# Patient Record
Sex: Male | Born: 1935 | Race: White | Hispanic: No | Marital: Married | Smoking: Never smoker
Health system: Southern US, Community
[De-identification: ages and names within clinical notes are randomized; demographics above are authoritative.]

## PROBLEM LIST (undated history)

## (undated) DIAGNOSIS — IMO0001 Reserved for inherently not codable concepts without codable children: Secondary | ICD-10-CM

## (undated) DIAGNOSIS — H409 Unspecified glaucoma: Secondary | ICD-10-CM

## (undated) DIAGNOSIS — K219 Gastro-esophageal reflux disease without esophagitis: Secondary | ICD-10-CM

## (undated) DIAGNOSIS — M1990 Unspecified osteoarthritis, unspecified site: Secondary | ICD-10-CM

## (undated) HISTORY — PX: CYST EXCISION: SHX5701

---

## 2008-10-04 ENCOUNTER — Encounter: Admission: RE | Admit: 2008-10-04 | Discharge: 2008-12-18 | Payer: Self-pay | Admitting: Orthopaedic Surgery

## 2008-11-08 ENCOUNTER — Ambulatory Visit (HOSPITAL_BASED_OUTPATIENT_CLINIC_OR_DEPARTMENT_OTHER): Admission: RE | Admit: 2008-11-08 | Discharge: 2008-11-08 | Payer: Self-pay | Admitting: Orthopaedic Surgery

## 2012-04-30 ENCOUNTER — Other Ambulatory Visit: Payer: Self-pay | Admitting: Specialist

## 2012-04-30 DIAGNOSIS — M533 Sacrococcygeal disorders, not elsewhere classified: Secondary | ICD-10-CM

## 2012-05-05 ENCOUNTER — Ambulatory Visit
Admission: RE | Admit: 2012-05-05 | Discharge: 2012-05-05 | Disposition: A | Discharge status: Home / Self Care | Payer: Medicare Other | Source: Ambulatory Visit | Attending: Specialist | Admitting: Specialist

## 2012-05-05 DIAGNOSIS — M533 Sacrococcygeal disorders, not elsewhere classified: Secondary | ICD-10-CM

## 2015-12-10 ENCOUNTER — Encounter: Payer: Self-pay | Admitting: *Deleted

## 2015-12-10 ENCOUNTER — Emergency Department (INDEPENDENT_AMBULATORY_CARE_PROVIDER_SITE_OTHER): Payer: Medicare Other

## 2015-12-10 ENCOUNTER — Emergency Department
Admission: EM | Admit: 2015-12-10 | Discharge: 2015-12-10 | Disposition: A | Discharge status: Home / Self Care | Payer: Medicare Other | Source: Home / Self Care | Attending: Family Medicine | Admitting: Family Medicine

## 2015-12-10 DIAGNOSIS — R079 Chest pain, unspecified: Secondary | ICD-10-CM

## 2015-12-10 DIAGNOSIS — M25512 Pain in left shoulder: Secondary | ICD-10-CM

## 2015-12-10 DIAGNOSIS — M19012 Primary osteoarthritis, left shoulder: Secondary | ICD-10-CM

## 2015-12-10 HISTORY — DX: Unspecified glaucoma: H40.9

## 2015-12-10 HISTORY — DX: Reserved for inherently not codable concepts without codable children: IMO0001

## 2015-12-10 HISTORY — DX: Gastro-esophageal reflux disease without esophagitis: K21.9

## 2015-12-10 HISTORY — DX: Unspecified osteoarthritis, unspecified site: M19.90

## 2015-12-10 NOTE — ED Notes (Signed)
Pt c/o 1 week of intermittent left arm soreness and intermittent CP. Chest pain "feels like indigestion does". Arm feels "sore". H/o arthritis and reflux. Pt reports he has not been taking his Protonix for 1 month.  EKG done and provider notified.

## 2015-12-10 NOTE — ED Provider Notes (Signed)
CSN: [id number]     Arrival date & time 12/10/15  0908 History   None    Chief Complaint  Patient presents with  . Chest Pain  . Arm Pain   (Consider location/radiation/quality/duration/timing/severity/associated sxs/prior Treatment) HPI  Pt is a 80yo male presenting to [facility] with c/o Left shoulder pain that radiates down his arm with intermittent Left sided chest pain.  Pain started about 1 week ago.  Pain is intermittent, sharp and sore in his Left shoulder.  Chest pain "feels like indigestion does."  Chest pain is intermittent and only lasts a few seconds at a time. Nothing seems to make it better or worse.  Left arm pain is sharp and sore, worse with laying on his Left side.  He reports hx of arthritis but it is worse in his knees. Denies recent heavy lifting or falls but states he does "tinker" at home now that he is retired.  Pt also notes he has not taken his Protonix for 1 month with permission from his PCP.  Pt's wife insisted pt come be evaluated because he mention the chest pain to her last night.  Denies abdominal pain, n/v/d. Denies fever, chills, cough or congestion. Denies prior hx of CAD.  Pt does f/u with his PCP, Dr. Donata Duff regularly.    Past Medical History  Diagnosis Date  . Glaucoma   . Reflux   . Arthritis    Past Surgical History  Procedure Laterality Date  . Cyst excision      from spine   Family History  Problem Relation Age of Onset  . Transient ischemic attack Mother   . Stroke Father    Social History  Substance Use Topics  . Smoking status: Never Smoker   . Smokeless tobacco: Never Used  . Alcohol Use: No    Review of Systems  Constitutional: Negative for fever and chills.  HENT: Negative for congestion, ear pain, sore throat, trouble swallowing and voice change.   Respiratory: Negative for cough and shortness of breath.   Cardiovascular: Positive for chest pain (Left side). Negative for palpitations.  Gastrointestinal: Negative for nausea,  vomiting, abdominal pain and diarrhea.  Musculoskeletal: Positive for myalgias and arthralgias ( Left shoulder and arm). Negative for back pain.  Skin: Negative for rash.  All other systems reviewed and are negative.   Allergies  Review of patient's allergies indicates no known allergies.  Home Medications   Prior to Admission medications   Medication Sig Start Date End Date Taking? Authorizing Provider  aspirin 81 MG tablet Take 81 mg by mouth daily.   Yes Historical Provider, MD  atorvastatin (LIPITOR) 10 MG tablet Take 10 mg by mouth daily.   Yes Historical Provider, MD  co-enzyme Q-10 30 MG capsule Take 30 mg by mouth 3 (three) times daily.   Yes Historical Provider, MD  Multiple Vitamins-Minerals (ICAPS AREDS 2 PO) Take by mouth.   Yes Historical Provider, MD  pantoprazole (PROTONIX) 40 MG tablet Take 40 mg by mouth daily.   Yes Historical Provider, MD  timolol (BETIMOL) 0.25 % ophthalmic solution 1-2 drops 2 (two) times daily.   Yes Historical Provider, MD   Meds Ordered and Administered this Visit  Medications - No data to display  BP 152/88 mmHg  Pulse 78  Resp 16  Wt 215 lb (97.523 kg)  SpO2 99% No data found.   Physical Exam  Constitutional: He appears well-developed and well-nourished. No distress.  HENT:  Head: Normocephalic and atraumatic.  Eyes: Conjunctivae  are normal. No scleral icterus.  Neck: Normal range of motion. Neck supple.  No midline bone tenderness, no crepitus or step-offs.   Cardiovascular: Normal rate, regular rhythm and normal heart sounds.   Pulses:      Radial pulses are 2+ on the left side.  Pulmonary/Chest: Effort normal and breath sounds normal. No respiratory distress. He has no wheezes. He has no rales. He exhibits tenderness ( Left side, no crepitus).  No respiratory distress, able to speak in full sentences w/o difficulty. Lungs: CTAB  Abdominal: Soft. Bowel sounds are normal. He exhibits no distension and no mass. There is no  tenderness. There is no rebound and no guarding.  Musculoskeletal: Normal range of motion. He exhibits tenderness. He exhibits no edema.  Left shoulder: no deformity or edema. Tenderness to superior aspect of deltoid.  Full ROM w/o crepitus. 5/5 strength in bilateral upper extremities.   Neurological: He is alert.  Left arm: normal sensation  Skin: Skin is warm and dry. No rash noted. He is not diaphoretic. No erythema. No pallor.  Nursing note and vitals reviewed.   ED Course  Procedures (including critical care time)  Labs Review Labs Reviewed - No data to display  Imaging Review Dg Chest 2 View  12/10/2015  CLINICAL DATA:  Left-sided chest pain that radiates into the left shoulder. Ongoing for 1 week. EXAM: CHEST  2 VIEW COMPARISON:  Left-sided chest pain radiating into the left shoulder for 1 week FINDINGS: The heart size and mediastinal contours are within normal limits. Both lungs are clear. The visualized skeletal structures are unremarkable. IMPRESSION: No active cardiopulmonary disease. Electronically Signed   By: Elige KoHetal  Patel   On: 12/10/2015 10:17   Dg Shoulder Left  12/10/2015  CLINICAL DATA:  Left-sided chest pain which radiates to the left shoulder for 1 week. EXAM: LEFT SHOULDER - 2+ VIEW COMPARISON:  Chest radiograph-earlier same day FINDINGS: No fracture or dislocation. Mild degenerative change of the glenohumeral joint with joint space loss, subchondral sclerosis, articular cyst versus irregularity and inferiorly directed osteophytosis. Limited visualization of the acromioclavicular joint is normal. No evidence of calcific tendinitis. Limited visualization of the adjacent thorax is normal. Regional soft tissues appear normal. IMPRESSION: 1. No acute findings. 2. Mild degenerative change of the left glenohumeral joint. Electronically Signed   By: Simonne ComeJohn  Watts M.D.   On: 12/10/2015 10:04   EKG: sinus rhythm, within normal limits   MDM   1. Left sided chest pain   2. Left  shoulder pain   3. Arthritis of left shoulder region    Pt is a 80yo male [facility] with c/o intermittent Left shoulder pain and Left sided chest pain for 1 week.  Chest pain is atypical for ACS.  Pain reproducible with palpation.  EKG: normal  CXR: no active cardiopulmonary disease  Left shoulder Plain films: Mild degenerative change of Left glenohumeral joint   Discussed imaging and EKG with pt and his wife. Reassured pt, pain likely due to arthritis in Left shoulder. Encouraged to call PCP for f/u appointment in 1 week for recheck of symptoms.  PCP may order additional testing. Discussed symptoms that warrant emergent care in the ED. Patient and wife verbalized understanding and agreement with treatment plan.    Junius Finnerrin O'Malley, PA-C 12/10/15 1036

## 2017-06-01 IMAGING — CR DG CHEST 2V
2 series · 2 of 2 positions shown · non-contrast
Comparison: Left-sided chest pain radiating into the left shoulder
for 1 week

CLINICAL DATA: Left-sided chest pain that radiates into the left
shoulder. Ongoing for 1 week.

EXAM:
CHEST  2 VIEW

[chest pa]
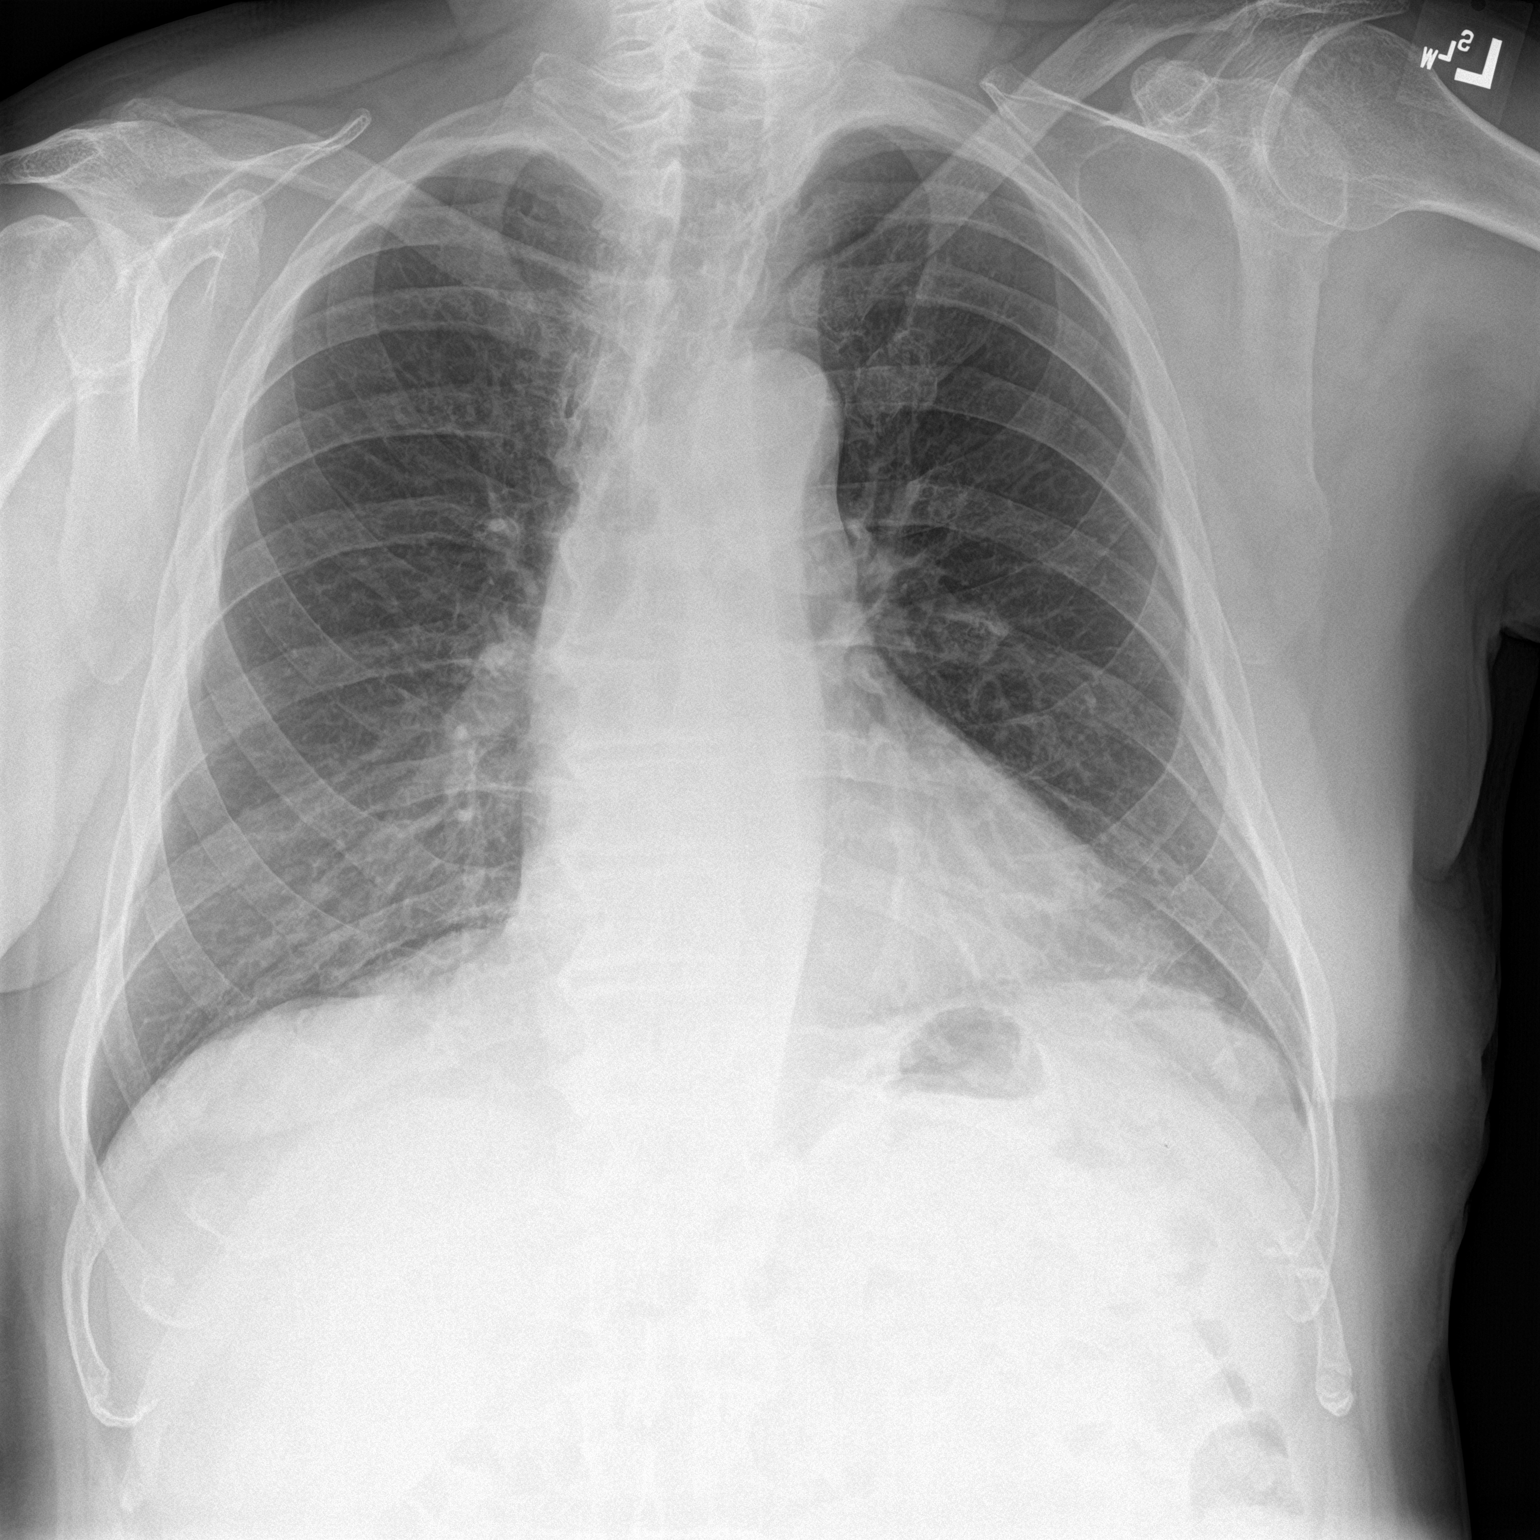

[chest lat]
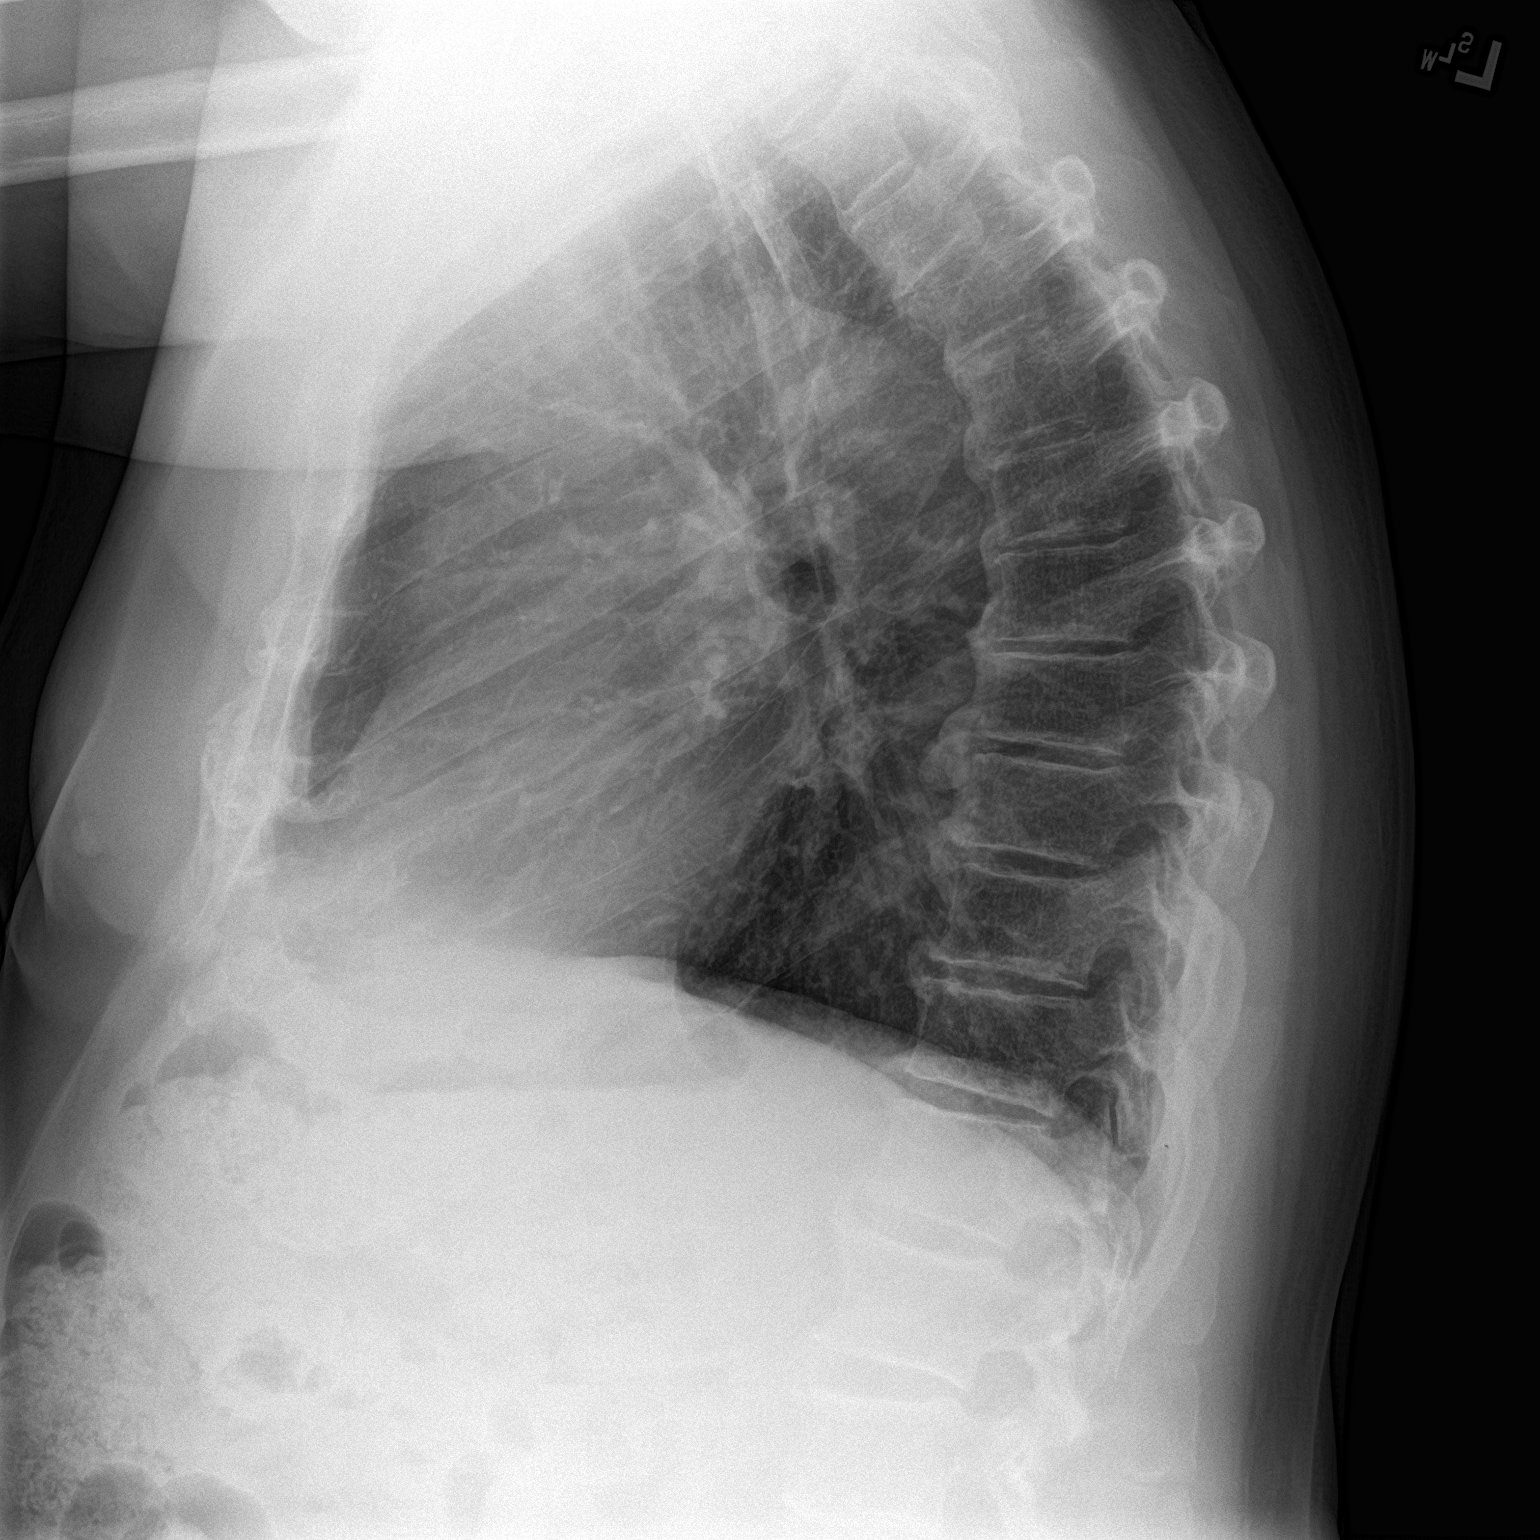

[2 of 2 positions shown; findings below may reference images not displayed]

FINDINGS: The heart size and mediastinal contours are within normal limits.
Both lungs are clear. The visualized skeletal structures are
unremarkable.
IMPRESSION: No active cardiopulmonary disease.

## 2019-04-05 ENCOUNTER — Encounter (INDEPENDENT_AMBULATORY_CARE_PROVIDER_SITE_OTHER): Payer: Self-pay | Admitting: Orthopaedic Surgery

## 2019-04-05 ENCOUNTER — Other Ambulatory Visit: Payer: Self-pay

## 2019-04-05 ENCOUNTER — Ambulatory Visit (INDEPENDENT_AMBULATORY_CARE_PROVIDER_SITE_OTHER): Payer: Medicare HMO

## 2019-04-05 ENCOUNTER — Ambulatory Visit (INDEPENDENT_AMBULATORY_CARE_PROVIDER_SITE_OTHER): Payer: Medicare HMO | Admitting: Orthopaedic Surgery

## 2019-04-05 VITALS — BP 111/46 | HR 83 | Ht 70.0 in | Wt 195.0 lb

## 2019-04-05 DIAGNOSIS — M25511 Pain in right shoulder: Secondary | ICD-10-CM

## 2019-04-05 DIAGNOSIS — M7501 Adhesive capsulitis of right shoulder: Secondary | ICD-10-CM | POA: Diagnosis not present

## 2019-04-05 MED ORDER — BUPIVACAINE HCL 0.5 % IJ SOLN
2.0000 mL | INTRAMUSCULAR | Status: AC | PRN
  Administered 2019-04-05: 2 mL via INTRA_ARTICULAR

## 2019-04-05 MED ORDER — METHYLPREDNISOLONE ACETATE 40 MG/ML IJ SUSP
80.0000 mg | INTRAMUSCULAR | Status: AC | PRN
  Administered 2019-04-05: 11:00:00 80 mg via INTRA_ARTICULAR

## 2019-04-05 MED ORDER — LIDOCAINE HCL 2 % IJ SOLN
2.0000 mL | INTRAMUSCULAR | Status: AC | PRN
  Administered 2019-04-05: 2 mL

## 2019-04-05 NOTE — Patient Instructions (Signed)
Shoulder Exercises Ask your health care provider which exercises are safe for you. Do exercises exactly as told by your health care provider and adjust them as directed. It is normal to feel mild stretching, pulling, tightness, or discomfort as you do these exercises, but you should stop right away if you feel sudden pain or your pain gets worse.Do not begin these exercises until told by your health care provider. Range of Motion Exercises        These exercises warm up your muscles and joints and improve the movement and flexibility of your shoulder. These exercises also help to relieve pain, numbness, and tingling. These exercises involve stretching your injured shoulder directly. Exercise A: Pendulum 1. Stand near a wall or a surface that you can hold onto for balance. 2. Bend at the waist and let your left / right arm hang straight down. Use your other arm to support you. Keep your back straight and do not lock your knees. 3. Relax your left / right arm and shoulder muscles, and move your hips and your trunk so your left / right arm swings freely. Your arm should swing because of the motion of your body, not because you are using your arm or shoulder muscles. 4. Keep moving your body so your arm swings in the following directions, as told by your health care provider: ? Side to side. ? Forward and backward. ? In clockwise and counterclockwise circles. 5. Continue each motion for __________ seconds, or for as long as told by your health care provider. 6. Slowly return to the starting position. Repeat __________ times. Complete this exercise __________ times a day. Exercise B:Flexion, Standing 1. Stand and hold a broomstick, a cane, or a similar object. Place your hands a little more than shoulder-width apart on the object. Your left / right hand should be palm-up, and your other hand should be palm-down. 2. Keep your elbow straight and keep your shoulder muscles relaxed. Push the stick  down with your healthy arm to raise your left / right arm in front of your body, and then over your head until you feel a stretch in your shoulder. ? Avoid shrugging your shoulder while you raise your arm. Keep your shoulder blade tucked down toward the middle of your back. 3. Hold for __________ seconds. 4. Slowly return to the starting position. Repeat __________ times. Complete this exercise __________ times a day. Exercise C: Abduction, Standing 1. Stand and hold a broomstick, a cane, or a similar object. Place your hands a little more than shoulder-width apart on the object. Your left / right hand should be palm-up, and your other hand should be palm-down. 2. While keeping your elbow straight and your shoulder muscles relaxed, push the stick across your body toward your left / right side. Raise your left / right arm to the side of your body and then over your head until you feel a stretch in your shoulder. ? Do not raise your arm above shoulder height, unless your health care provider tells you to do that. ? Avoid shrugging your shoulder while you raise your arm. Keep your shoulder blade tucked down toward the middle of your back. 3. Hold for __________ seconds. 4. Slowly return to the starting position. Repeat __________ times. Complete this exercise __________ times a day. Exercise D:Internal Rotation 1. Place your left / right hand behind your back, palm-up. 2. Use your other hand to dangle an exercise band, a towel, or a similar object over your shoulder.   Grasp the band with your left / right hand so you are holding onto both ends. 3. Gently pull up on the band until you feel a stretch in the front of your left / right shoulder. ? Avoid shrugging your shoulder while you raise your arm. Keep your shoulder blade tucked down toward the middle of your back. 4. Hold for __________ seconds. 5. Release the stretch by letting go of the band and lowering your hands. Repeat __________ times.  Complete this exercise __________ times a day. Stretching Exercises  These exercises warm up your muscles and joints and improve the movement and flexibility of your shoulder. These exercises also help to relieve pain, numbness, and tingling. These exercises are done using your healthy shoulder to help stretch the muscles of your injured shoulder. Exercise E: Corner Stretch (External Rotation and Abduction) 1. Stand in a doorway with one of your feet slightly in front of the other. This is called a staggered stance. If you cannot reach your forearms to the door frame, stand facing a corner of a room. 2. Choose one of the following positions as told by your health care provider: ? Place your hands and forearms on the door frame above your head. ? Place your hands and forearms on the door frame at the height of your head. ? Place your hands on the door frame at the height of your elbows. 3. Slowly move your weight onto your front foot until you feel a stretch across your chest and in the front of your shoulders. Keep your head and chest upright and keep your abdominal muscles tight. 4. Hold for __________ seconds. 5. To release the stretch, shift your weight to your back foot. Repeat __________ times. Complete this stretch __________ times a day. Exercise F:Extension, Standing 1. Stand and hold a broomstick, a cane, or a similar object behind your back. ? Your hands should be a little wider than shoulder-width apart. ? Your palms should face away from your back. 2. Keeping your elbows straight and keeping your shoulder muscles relaxed, move the stick away from your body until you feel a stretch in your shoulder. ? Avoid shrugging your shoulders while you move the stick. Keep your shoulder blade tucked down toward the middle of your back. 3. Hold for __________ seconds. 4. Slowly return to the starting position. Repeat __________ times. Complete this exercise __________ times a  day. Strengthening Exercises           These exercises build strength and endurance in your shoulder. Endurance is the ability to use your muscles for a long time, even after they get tired. Exercise G:External Rotation 1. Sit in a stable chair without armrests. 2. Secure an exercise band at elbow height on your left / right side. 3. Place a soft object, such as a folded towel or a small pillow, between your left / right upper arm and your body to move your elbow a few inches away (about 10 cm) from your side. 4. Hold the end of the band so it is tight and there is no slack. 5. Keeping your elbow pressed against the soft object, move your left / right forearm out, away from your abdomen. Keep your body steady so only your forearm moves. 6. Hold for __________ seconds. 7. Slowly return to the starting position. Repeat __________ times. Complete this exercise __________ times a day. Exercise H:Shoulder Abduction 1. Sit in a stable chair without armrests, or stand. 2. Hold a __________ weight in your   left / right hand, or hold an exercise band with both hands. 3. Start with your arms straight down and your left / right palm facing in, toward your body. 4. Slowly lift your left / right hand out to your side. Do not lift your hand above shoulder height unless your health care provider tells you that this is safe. ? Keep your arms straight. ? Avoid shrugging your shoulder while you do this movement. Keep your shoulder blade tucked down toward the middle of your back. 5. Hold for __________ seconds. 6. Slowly lower your arm, and return to the starting position. Repeat __________ times. Complete this exercise __________ times a day. Exercise I:Shoulder Extension 1. Sit in a stable chair without armrests, or stand. 2. Secure an exercise band to a stable object in front of you where it is at shoulder height. 3. Hold one end of the exercise band in each hand. Your palms should face each  other. 4. Straighten your elbows and lift your hands up to shoulder height. 5. Step back, away from the secured end of the exercise band, until the band is tight and there is no slack. 6. Squeeze your shoulder blades together as you pull your hands down to the sides of your thighs. Stop when your hands are straight down by your sides. Do not let your hands go behind your body. 7. Hold for __________ seconds. 8. Slowly return to the starting position. Repeat __________ times. Complete this exercise __________ times a day. Exercise J:Standing Shoulder Row 1. Sit in a stable chair without armrests, or stand. 2. Secure an exercise band to a stable object in front of you so it is at waist height. 3. Hold one end of the exercise band in each hand. Your palms should be in a thumbs-up position. 4. Bend each of your elbows to an "L" shape (about 90 degrees) and keep your upper arms at your sides. 5. Step back until the band is tight and there is no slack. 6. Slowly pull your elbows back behind you. 7. Hold for __________ seconds. 8. Slowly return to the starting position. Repeat __________ times. Complete this exercise __________ times a day. Exercise K:Shoulder Press-Ups 1. Sit in a stable chair that has armrests. Sit upright, with your feet flat on the floor. 2. Put your hands on the armrests so your elbows are bent and your fingers are pointing forward. Your hands should be about even with the sides of your body. 3. Push down on the armrests and use your arms to lift yourself off of the chair. Straighten your elbows and lift yourself up as much as you comfortably can. ? Move your shoulder blades down, and avoid letting your shoulders move up toward your ears. ? Keep your feet on the ground. As you get stronger, your feet should support less of your body weight as you lift yourself up. 4. Hold for __________ seconds. 5. Slowly lower yourself back into the chair. Repeat __________ times. Complete  this exercise __________ times a day. Exercise L: Wall Push-Ups 1. Stand so you are facing a stable wall. Your feet should be about one arm-length away from the wall. 2. Lean forward and place your palms on the wall at shoulder height. 3. Keep your feet flat on the floor as you bend your elbows and lean forward toward the wall. 4. Hold for __________ seconds. 5. Straighten your elbows to push yourself back to the starting position. Repeat __________ times. Complete this exercise __________ times   a day. This information is not intended to replace advice given to you by your health care provider. Make sure you discuss any questions you have with your health care provider. Document Released: 10/29/2005 Document Revised: 04/20/2018 Document Reviewed: 08/26/2015 [employer] Interactive Patient Education  2019 [employer]  

## 2019-04-05 NOTE — Progress Notes (Signed)
Office Visit Note   Patient: Derek Parsons           Date of Birth: 22-Mar-1935           MRN: [id number] Visit Date: 04/05/2019              Requested by: No referring provider defined for this encounter. PCP: Derek Senegal, MD   Assessment & Plan: Visit Diagnoses:  1. Acute pain of right shoulder   2. Adhesive capsulitis of right shoulder     Plan:  #1: At this time I did call Derek Parsons office and question the use of corticosteroid injection with his upcoming surgery.  The nurse returned a call saying that Derek Parsons said it was fine for the injection.  Therefore we will do a subacromial injection with an intra-articular injection of the right shoulder.  Follow-Up Instructions: No follow-ups on file.   Face-to-face time spent with patient was greater than 30 minutes.  Greater than 50% of the time was spent in counseling and coordination of care and clearance from Urologist for injection  Orders:  Orders Placed This Encounter  Procedures  . XR Shoulder Right   No orders of the defined types were placed in this encounter.     Procedures: Large Joint Inj: R subacromial bursa on 04/05/2019 10:45 AM Indications: pain and diagnostic evaluation Details: 25 G 1.5 in needle, anterolateral approach  Arthrogram: No  Medications: 2 mL lidocaine 2 %; 2 mL bupivacaine 0.5 %; 80 mg methylPREDNISolone acetate 40 MG/ML  Also injection into the glenohumeral joint as well as subacromial bursa. Consent was given by the patient. Immediately prior to procedure a time out was called to verify the correct patient, procedure, equipment, support staff and site/side marked as required. Patient was prepped and draped in the usual sterile fashion.       Clinical Data: No additional findings.   Subjective: Chief Complaint  Patient presents with  . Right Shoulder - Pain  Patient presents today for right shoulder pain X2 months. No known injury. His shoulder hurts all throughout and  sometimes radiates down to his elbow. No numbness or tingling in his arms. He does feel that his right arm is weaker than the left. He states that his range of motion has decreased. He is taking tylenol for pain.   HPI  Derek Parsons is a very pleasant 84 year old white male who presents today with a two-month history of pain in his right shoulder without history of injury or trauma.  It is in the shoulder itself in regards to his pain but does have some radicular type symptoms to his elbow without numbness or tingling in the arms.  He complains about weakness in the right arm in comparison to the left.  He also has noted that his range of motion has been decreased.  He is taking Tylenol only for pain.  He does have an upcoming surgery by Derek Parsons to include that of a cystoscopy with stent placement, left ureteroscopy, and possible biopsy.  Seen today for evaluation.  Review of Systems  Constitutional: Negative for fatigue.  HENT: Negative for ear pain.   Eyes: Negative for pain.  Respiratory: Negative for shortness of breath.   Cardiovascular: Negative for leg swelling.  Gastrointestinal: Negative for constipation and diarrhea.  Endocrine: Positive for cold intolerance. Negative for heat intolerance.  Genitourinary: Negative for difficulty urinating.  Musculoskeletal: Negative for joint swelling.  Skin: Negative for rash.  Allergic/Immunologic: Negative for food allergies.  Neurological: Negative for weakness.  Hematological: Does not bruise/bleed easily.  Psychiatric/Behavioral: Negative for sleep disturbance.     Objective: Vital Signs: BP (!) 111/46   Pulse 83   Ht 5\' 10"  (1.778 m)   Wt 195 lb (88.5 kg)   BMI 27.98 kg/m   Physical Exam Constitutional:      Appearance: He is well-developed.  Eyes:     Pupils: Pupils are equal, round, and reactive to light.  Pulmonary:     Effort: Pulmonary effort is normal.  Skin:    General: Skin is warm and dry.  Neurological:     Mental  Status: He is alert and oriented to person, place, and time.  Psychiatric:        Behavior: Behavior normal.     Ortho Exam  Decreased ROM .  Abduction to  80 degrees.  Forward flexion 105 degrees.  External rotation with the arm at his side is around 30 degrees.  Internal rotation hand to abdomen.  He is able to hold the arm at 90 degrees of abduction against resistance.  Good resistance with internal and external rotation.  He does appear to have some atrophy in the supraspinatus muscle.  Positive empty can test.   Specialty Comments:  No specialty comments available.  Imaging: Xr Shoulder Right  Result Date: 04/05/2019 4 view x-ray of the right shoulder reveals marked anterior spurring of the acromion.  AC joint space narrowing with marked degenerative changes noted.  Type II acromium. Some spurring about the glenoid.  Good maintenance of joint space.    PMFS History: Current Outpatient Medications  Medication Sig Dispense Refill  . aspirin 81 MG tablet Take 81 mg by mouth daily.    Marland Kitchen. donepezil (ARICEPT) 5 MG tablet Take by mouth.    . memantine (NAMENDA TITRATION PACK) tablet pack See admin instructions. follow package directions    . Multiple Vitamins-Minerals (ICAPS AREDS 2 PO) Take by mouth.    . Multiple Vitamins-Minerals (PRESERVISION AREDS 2 PO) Take by mouth.    . tamsulosin (FLOMAX) 0.4 MG CAPS capsule Take 0.4 mg by mouth daily.    . timolol (BETIMOL) 0.25 % ophthalmic solution 1-2 drops 2 (two) times daily.    Marland Kitchen. atorvastatin (LIPITOR) 10 MG tablet Take 10 mg by mouth daily.    Marland Kitchen. co-enzyme Q-10 30 MG capsule Take 30 mg by mouth 3 (three) times daily.    . pantoprazole (PROTONIX) 40 MG tablet Take 40 mg by mouth daily.     No current facility-administered medications for this visit.     There are no active problems to display for this patient.  Past Medical History:  Diagnosis Date  . Arthritis   . Glaucoma   . Reflux     Family History  Problem Relation Age  of Onset  . Transient ischemic attack Mother   . Stroke Father     Past Surgical History:  Procedure Laterality Date  . CYST EXCISION     from spine   Social History   Occupational History  . Not on file  Tobacco Use  . Smoking status: Never Smoker  . Smokeless tobacco: Never Used  Substance and Sexual Activity  . Alcohol use: No  . Drug use: No  . Sexual activity: Not on file

## 2019-06-29 DEATH — deceased
